# Patient Record
Sex: Female | Born: 1960 | Race: White | Hispanic: No | Marital: Married
Health system: Southern US, Community
[De-identification: ages and names within clinical notes are randomized; demographics above are authoritative.]

---

## 1997-12-05 ENCOUNTER — Ambulatory Visit (HOSPITAL_BASED_OUTPATIENT_CLINIC_OR_DEPARTMENT_OTHER): Admission: RE | Admit: 1997-12-05 | Discharge: 1997-12-05 | Payer: Self-pay | Admitting: General Surgery

## 1999-12-09 ENCOUNTER — Other Ambulatory Visit: Admission: RE | Admit: 1999-12-09 | Discharge: 1999-12-09 | Payer: Self-pay | Admitting: *Deleted

## 2000-11-16 ENCOUNTER — Inpatient Hospital Stay (HOSPITAL_COMMUNITY): Admission: AD | Admit: 2000-11-16 | Discharge: 2000-11-17 | Payer: Self-pay | Admitting: Obstetrics and Gynecology

## 2000-12-23 ENCOUNTER — Other Ambulatory Visit: Admission: RE | Admit: 2000-12-23 | Discharge: 2000-12-23 | Payer: Self-pay | Admitting: Obstetrics and Gynecology

## 2002-03-16 ENCOUNTER — Other Ambulatory Visit: Admission: RE | Admit: 2002-03-16 | Discharge: 2002-03-16 | Payer: Self-pay | Admitting: Obstetrics and Gynecology

## 2003-09-01 ENCOUNTER — Other Ambulatory Visit: Admission: RE | Admit: 2003-09-01 | Discharge: 2003-09-01 | Payer: Self-pay | Admitting: Obstetrics and Gynecology

## 2003-09-19 ENCOUNTER — Other Ambulatory Visit: Admission: RE | Admit: 2003-09-19 | Discharge: 2003-09-19 | Payer: Self-pay | Admitting: Radiology

## 2012-11-08 ENCOUNTER — Other Ambulatory Visit: Payer: Self-pay | Admitting: Otolaryngology

## 2012-11-08 DIAGNOSIS — D333 Benign neoplasm of cranial nerves: Secondary | ICD-10-CM

## 2012-12-06 ENCOUNTER — Ambulatory Visit
Admission: RE | Admit: 2012-12-06 | Discharge: 2012-12-06 | Disposition: A | Discharge status: Home / Self Care | Payer: BC Managed Care – PPO | Source: Ambulatory Visit | Attending: Otolaryngology | Admitting: Otolaryngology

## 2012-12-06 DIAGNOSIS — D333 Benign neoplasm of cranial nerves: Secondary | ICD-10-CM

## 2012-12-06 MED ORDER — GADOBENATE DIMEGLUMINE 529 MG/ML IV SOLN
10.0000 mL | Freq: Once | INTRAVENOUS | Status: AC | PRN
  Administered 2012-12-06: 10 mL via INTRAVENOUS

## 2013-01-13 ENCOUNTER — Ambulatory Visit: Payer: BC Managed Care – PPO | Attending: Otolaryngology | Admitting: Rehabilitative and Restorative Service Providers"

## 2013-01-13 DIAGNOSIS — R269 Unspecified abnormalities of gait and mobility: Secondary | ICD-10-CM | POA: Insufficient documentation

## 2013-01-13 DIAGNOSIS — IMO0001 Reserved for inherently not codable concepts without codable children: Secondary | ICD-10-CM | POA: Insufficient documentation

## 2013-01-19 ENCOUNTER — Ambulatory Visit: Payer: BC Managed Care – PPO | Admitting: Rehabilitative and Restorative Service Providers"

## 2013-02-01 ENCOUNTER — Ambulatory Visit: Payer: BC Managed Care – PPO | Attending: Otolaryngology | Admitting: Rehabilitative and Restorative Service Providers"

## 2013-02-01 DIAGNOSIS — R269 Unspecified abnormalities of gait and mobility: Secondary | ICD-10-CM | POA: Insufficient documentation

## 2013-02-01 DIAGNOSIS — IMO0001 Reserved for inherently not codable concepts without codable children: Secondary | ICD-10-CM | POA: Insufficient documentation

## 2013-05-30 ENCOUNTER — Other Ambulatory Visit: Payer: Self-pay | Admitting: Otolaryngology

## 2013-05-30 DIAGNOSIS — D333 Benign neoplasm of cranial nerves: Secondary | ICD-10-CM

## 2013-05-30 DIAGNOSIS — H905 Unspecified sensorineural hearing loss: Secondary | ICD-10-CM

## 2013-06-09 ENCOUNTER — Ambulatory Visit
Admission: RE | Admit: 2013-06-09 | Discharge: 2013-06-09 | Disposition: A | Discharge status: Home / Self Care | Payer: BC Managed Care – PPO | Source: Ambulatory Visit | Attending: Otolaryngology | Admitting: Otolaryngology

## 2013-06-09 DIAGNOSIS — D333 Benign neoplasm of cranial nerves: Secondary | ICD-10-CM

## 2013-06-09 DIAGNOSIS — H905 Unspecified sensorineural hearing loss: Secondary | ICD-10-CM

## 2013-06-09 MED ORDER — GADOBENATE DIMEGLUMINE 529 MG/ML IV SOLN
10.0000 mL | Freq: Once | INTRAVENOUS | Status: AC | PRN
  Administered 2013-06-09: 10 mL via INTRAVENOUS

## 2013-11-29 ENCOUNTER — Other Ambulatory Visit: Payer: Self-pay | Admitting: Otolaryngology

## 2013-11-29 DIAGNOSIS — D333 Benign neoplasm of cranial nerves: Secondary | ICD-10-CM

## 2013-12-12 ENCOUNTER — Ambulatory Visit
Admission: RE | Admit: 2013-12-12 | Discharge: 2013-12-12 | Disposition: A | Discharge status: Home / Self Care | Payer: BC Managed Care – PPO | Source: Ambulatory Visit | Attending: Otolaryngology | Admitting: Otolaryngology

## 2013-12-12 DIAGNOSIS — D333 Benign neoplasm of cranial nerves: Secondary | ICD-10-CM

## 2013-12-12 MED ORDER — GADOBENATE DIMEGLUMINE 529 MG/ML IV SOLN
11.0000 mL | Freq: Once | INTRAVENOUS | Status: AC | PRN
  Administered 2013-12-12: 11 mL via INTRAVENOUS

## 2014-05-29 ENCOUNTER — Other Ambulatory Visit: Payer: Self-pay | Admitting: Otolaryngology

## 2014-05-29 DIAGNOSIS — D333 Benign neoplasm of cranial nerves: Secondary | ICD-10-CM

## 2014-06-14 ENCOUNTER — Ambulatory Visit
Admission: RE | Admit: 2014-06-14 | Discharge: 2014-06-14 | Disposition: A | Discharge status: Home / Self Care | Payer: BC Managed Care – PPO | Source: Ambulatory Visit | Attending: Otolaryngology | Admitting: Otolaryngology

## 2014-06-14 DIAGNOSIS — D333 Benign neoplasm of cranial nerves: Secondary | ICD-10-CM

## 2014-06-14 MED ORDER — GADOBENATE DIMEGLUMINE 529 MG/ML IV SOLN
12.0000 mL | Freq: Once | INTRAVENOUS | Status: AC | PRN
  Administered 2014-06-14: 12 mL via INTRAVENOUS

## 2014-11-30 ENCOUNTER — Other Ambulatory Visit: Payer: Self-pay | Admitting: Otolaryngology

## 2014-11-30 DIAGNOSIS — D333 Benign neoplasm of cranial nerves: Secondary | ICD-10-CM

## 2014-11-30 DIAGNOSIS — H9041 Sensorineural hearing loss, unilateral, right ear, with unrestricted hearing on the contralateral side: Secondary | ICD-10-CM

## 2014-12-11 ENCOUNTER — Other Ambulatory Visit: Payer: Self-pay

## 2014-12-14 ENCOUNTER — Other Ambulatory Visit: Payer: Self-pay

## 2014-12-14 ENCOUNTER — Ambulatory Visit
Admission: RE | Admit: 2014-12-14 | Discharge: 2014-12-14 | Disposition: A | Discharge status: Home / Self Care | Payer: BLUE CROSS/BLUE SHIELD | Source: Ambulatory Visit | Attending: Otolaryngology | Admitting: Otolaryngology

## 2014-12-14 DIAGNOSIS — D333 Benign neoplasm of cranial nerves: Secondary | ICD-10-CM

## 2014-12-14 DIAGNOSIS — H9041 Sensorineural hearing loss, unilateral, right ear, with unrestricted hearing on the contralateral side: Secondary | ICD-10-CM

## 2014-12-14 MED ORDER — GADOBENATE DIMEGLUMINE 529 MG/ML IV SOLN: 10.0000 mL | Freq: Once | INTRAVENOUS | Status: AC | PRN

## 2015-05-31 ENCOUNTER — Other Ambulatory Visit: Payer: Self-pay | Admitting: Otolaryngology

## 2015-05-31 DIAGNOSIS — D333 Benign neoplasm of cranial nerves: Secondary | ICD-10-CM

## 2015-05-31 DIAGNOSIS — H933X1 Disorders of right acoustic nerve: Secondary | ICD-10-CM

## 2015-06-15 ENCOUNTER — Ambulatory Visit
Admission: RE | Admit: 2015-06-15 | Discharge: 2015-06-15 | Disposition: A | Discharge status: Home / Self Care | Payer: BLUE CROSS/BLUE SHIELD | Source: Ambulatory Visit | Attending: Otolaryngology | Admitting: Otolaryngology

## 2015-06-15 DIAGNOSIS — D333 Benign neoplasm of cranial nerves: Secondary | ICD-10-CM

## 2015-06-15 DIAGNOSIS — H933X1 Disorders of right acoustic nerve: Secondary | ICD-10-CM

## 2015-06-15 MED ORDER — GADOBENATE DIMEGLUMINE 529 MG/ML IV SOLN
11.0000 mL | Freq: Once | INTRAVENOUS | Status: AC | PRN
  Administered 2015-06-15: 11 mL via INTRAVENOUS

## 2015-11-19 ENCOUNTER — Other Ambulatory Visit: Payer: Self-pay | Admitting: Otolaryngology

## 2015-11-19 DIAGNOSIS — D333 Benign neoplasm of cranial nerves: Secondary | ICD-10-CM

## 2015-11-29 ENCOUNTER — Ambulatory Visit
Admission: RE | Admit: 2015-11-29 | Discharge: 2015-11-29 | Disposition: A | Discharge status: Home / Self Care | Payer: BLUE CROSS/BLUE SHIELD | Source: Ambulatory Visit | Attending: Otolaryngology | Admitting: Otolaryngology

## 2015-11-29 DIAGNOSIS — D333 Benign neoplasm of cranial nerves: Secondary | ICD-10-CM

## 2015-11-29 MED ORDER — GADOBENATE DIMEGLUMINE 529 MG/ML IV SOLN
10.0000 mL | Freq: Once | INTRAVENOUS | Status: AC | PRN
  Administered 2015-11-29: 10 mL via INTRAVENOUS

## 2016-05-30 ENCOUNTER — Other Ambulatory Visit: Payer: Self-pay | Admitting: Otolaryngology

## 2016-05-30 DIAGNOSIS — H933X1 Disorders of right acoustic nerve: Secondary | ICD-10-CM

## 2016-06-11 ENCOUNTER — Ambulatory Visit
Admission: RE | Admit: 2016-06-11 | Discharge: 2016-06-11 | Disposition: A | Discharge status: Home / Self Care | Payer: BLUE CROSS/BLUE SHIELD | Source: Ambulatory Visit | Attending: Otolaryngology | Admitting: Otolaryngology

## 2016-06-11 DIAGNOSIS — H933X1 Disorders of right acoustic nerve: Secondary | ICD-10-CM

## 2016-06-11 MED ORDER — GADOBENATE DIMEGLUMINE 529 MG/ML IV SOLN
10.0000 mL | Freq: Once | INTRAVENOUS | Status: AC | PRN
  Administered 2016-06-11: 10 mL via INTRAVENOUS

## 2016-11-14 ENCOUNTER — Other Ambulatory Visit: Payer: Self-pay | Admitting: Otolaryngology

## 2016-11-14 DIAGNOSIS — H933X1 Disorders of right acoustic nerve: Secondary | ICD-10-CM

## 2016-11-27 ENCOUNTER — Ambulatory Visit
Admission: RE | Admit: 2016-11-27 | Discharge: 2016-11-27 | Disposition: A | Discharge status: Home / Self Care | Payer: BLUE CROSS/BLUE SHIELD | Source: Ambulatory Visit | Attending: Otolaryngology | Admitting: Otolaryngology

## 2016-11-27 DIAGNOSIS — H933X1 Disorders of right acoustic nerve: Secondary | ICD-10-CM

## 2016-11-27 MED ORDER — GADOBENATE DIMEGLUMINE 529 MG/ML IV SOLN
10.0000 mL | Freq: Once | INTRAVENOUS | Status: AC | PRN
  Administered 2016-11-27: 10 mL via INTRAVENOUS

## 2017-06-03 ENCOUNTER — Other Ambulatory Visit: Payer: Self-pay | Admitting: Otolaryngology

## 2017-06-03 DIAGNOSIS — H933X1 Disorders of right acoustic nerve: Secondary | ICD-10-CM

## 2017-06-15 ENCOUNTER — Ambulatory Visit
Admission: RE | Admit: 2017-06-15 | Discharge: 2017-06-15 | Disposition: A | Discharge status: Home / Self Care | Payer: BLUE CROSS/BLUE SHIELD | Source: Ambulatory Visit | Attending: Otolaryngology | Admitting: Otolaryngology

## 2017-06-15 DIAGNOSIS — H933X1 Disorders of right acoustic nerve: Secondary | ICD-10-CM

## 2017-06-15 MED ORDER — GADOBENATE DIMEGLUMINE 529 MG/ML IV SOLN
10.0000 mL | Freq: Once | INTRAVENOUS | Status: AC | PRN
  Administered 2017-06-15: 10 mL via INTRAVENOUS

## 2017-12-03 ENCOUNTER — Other Ambulatory Visit: Payer: Self-pay | Admitting: Otolaryngology

## 2017-12-03 DIAGNOSIS — D333 Benign neoplasm of cranial nerves: Secondary | ICD-10-CM

## 2017-12-03 DIAGNOSIS — H933X1 Disorders of right acoustic nerve: Secondary | ICD-10-CM

## 2017-12-14 ENCOUNTER — Ambulatory Visit
Admission: RE | Admit: 2017-12-14 | Discharge: 2017-12-14 | Disposition: A | Discharge status: Home / Self Care | Payer: BLUE CROSS/BLUE SHIELD | Source: Ambulatory Visit | Attending: Otolaryngology | Admitting: Otolaryngology

## 2017-12-14 DIAGNOSIS — D333 Benign neoplasm of cranial nerves: Secondary | ICD-10-CM

## 2017-12-14 DIAGNOSIS — H933X1 Disorders of right acoustic nerve: Secondary | ICD-10-CM

## 2017-12-14 MED ORDER — GADOBENATE DIMEGLUMINE 529 MG/ML IV SOLN
10.0000 mL | Freq: Once | INTRAVENOUS | Status: AC | PRN
  Administered 2017-12-14: 10 mL via INTRAVENOUS

## 2018-05-02 IMAGING — MR MR HEAD WO/W CM
7 of 11 series · 28 of 48 positions shown · IV contrast (multihance)
Comparison: MRI of the brain and IAC 06/15/2015 and 12/06/2012.

CLINICAL DATA: Known right-sided vestibular schwannoma. Right-sided
hearing loss and tinnitus for 4 years.

EXAM:
MRI HEAD WITHOUT AND WITH CONTRAST
TECHNIQUE: Multiplanar, multiecho pulse sequences of the brain and surrounding
structures were obtained without and with intravenous contrast.
CONTRAST:  10mL MULTIHANCE GADOBENATE DIMEGLUMINE 529 MG/ML IV SOLN

[Series 2: T1 · sagittal · 5.0mm · 0.45mm/px · 4 of 19 slices shown]
[im 1/19]
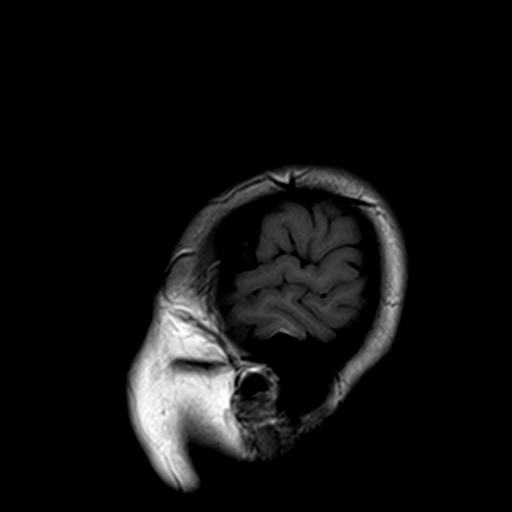
[im 7/19]
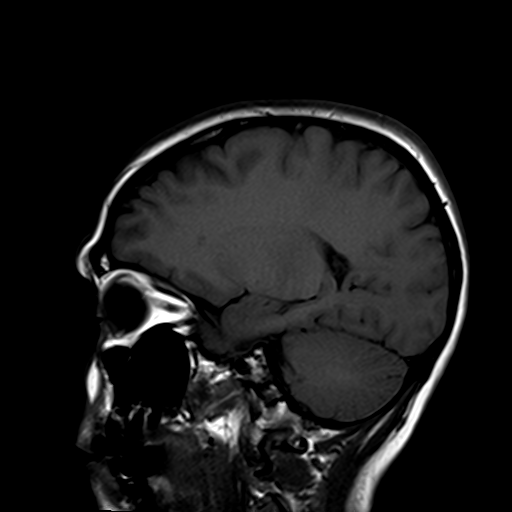
[im 13/19]
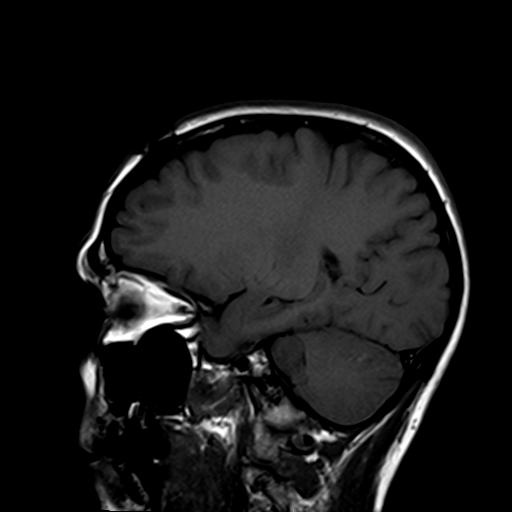
[im 19/19]
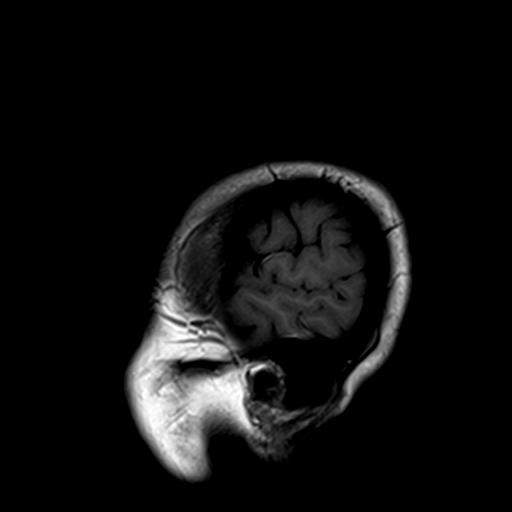

[Series 3: DWI · axial · 3.0mm · 0.94mm/px · z∈[-52,+87]mm · 9 of 96 slices shown]
[im 1/96]
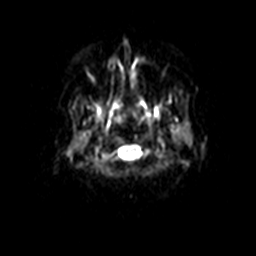
[im 16/96]
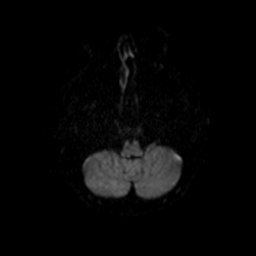
[im 32/96]
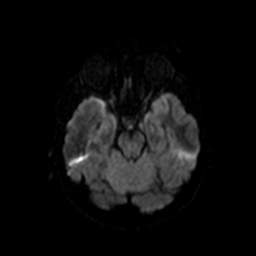
[im 40/96]
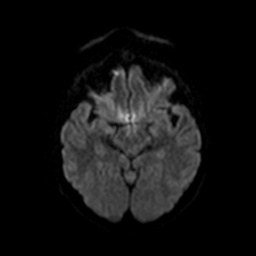
[im 48/96]
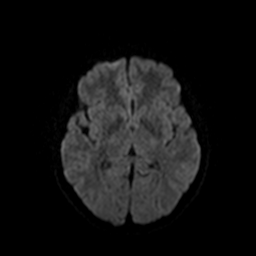
[im 56/96]
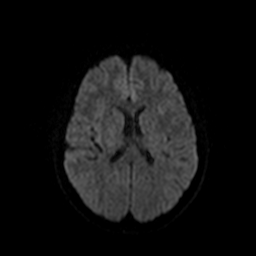
[im 64/96]
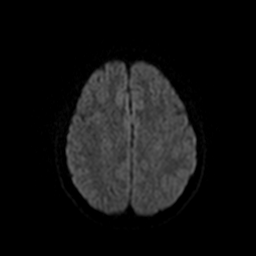
[im 80/96]
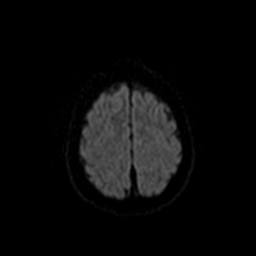
[im 96/96]
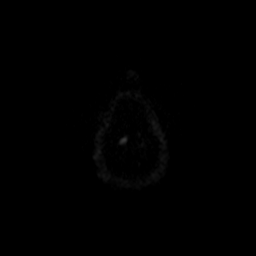

[Series 4: dwi_adc · axial · 3.0mm · 0.94mm/px · z∈[-52,+87]mm · 6 of 48 slices shown]
[im 1/48]
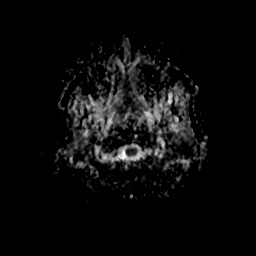
[im 10/48]
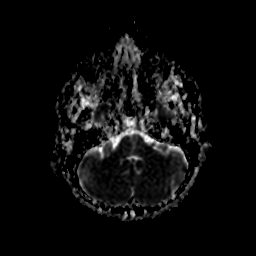
[im 19/48]
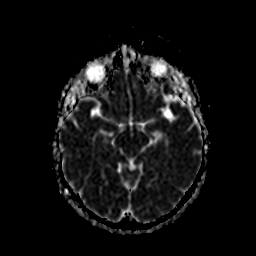
[im 29/48]
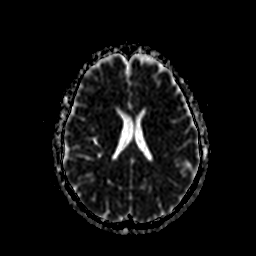
[im 38/48]
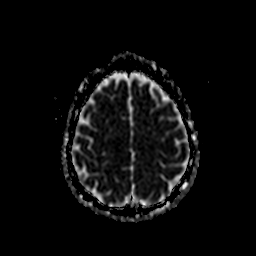
[im 48/48]
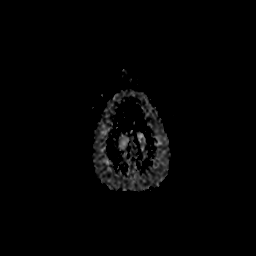

[Series 5: T2 · axial · 5.0mm · 0.45mm/px · z∈[-53,+82]mm · 3 of 22 slices shown]
[im 1/22]
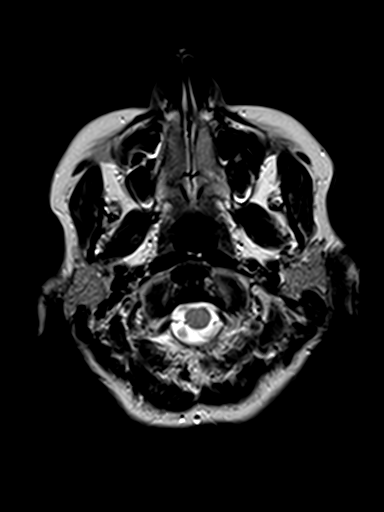
[im 11/22]
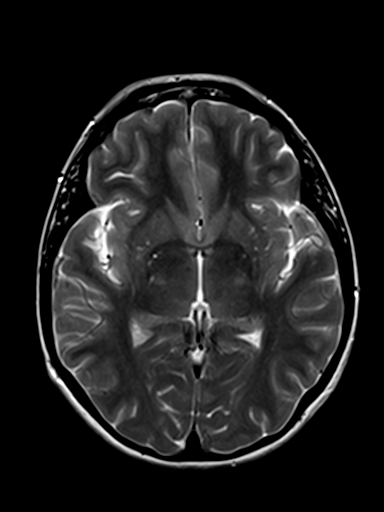
[im 22/22]
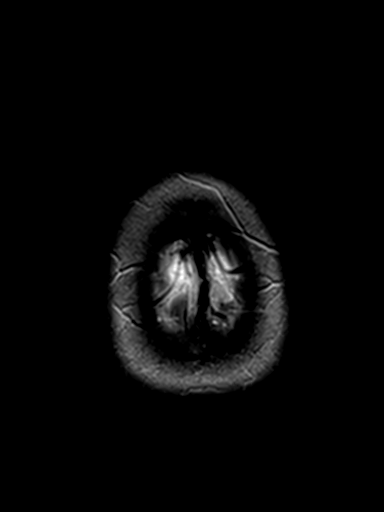

[Series 6: FLAIR · axial · 5.0mm · 0.45mm/px · z∈[-53,+82]mm · 3 of 22 slices shown]
[im 1/22]
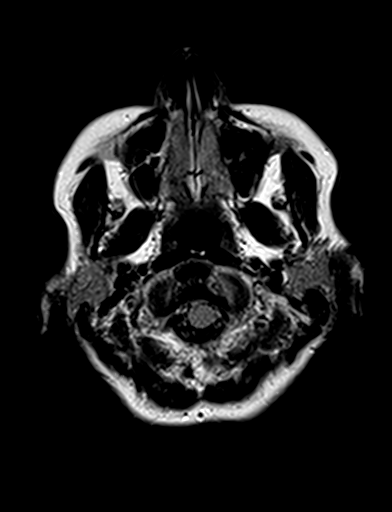
[im 11/22]
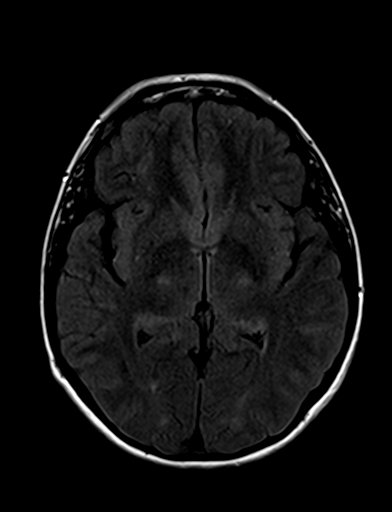
[im 22/22]
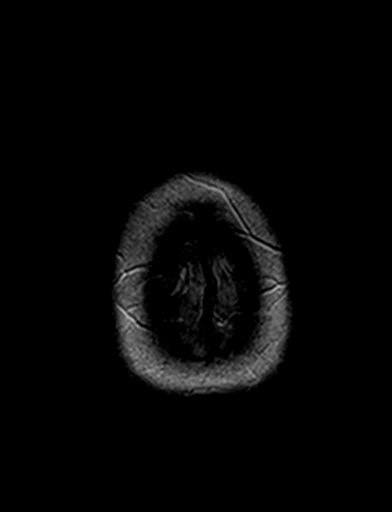

[Series 7: cor 3mm · coronal · 3.0mm · 0.35mm/px · 1 of 11 slices shown]
[im 1/11]
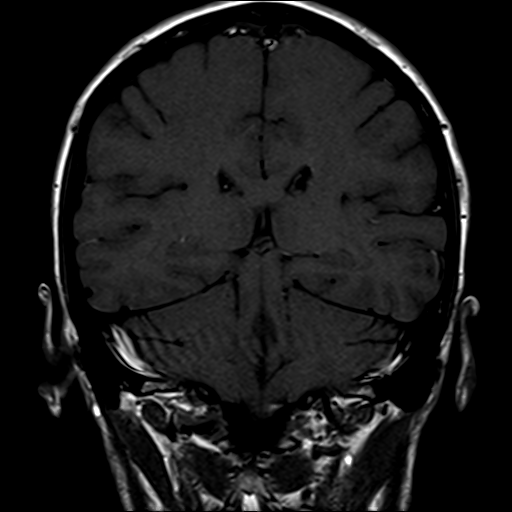

[Series 8: bSSFP · axial · 1.0mm · 0.23mm/px · z∈[-56,-47]mm · 2 of 40 slices shown]
[im 1/40]
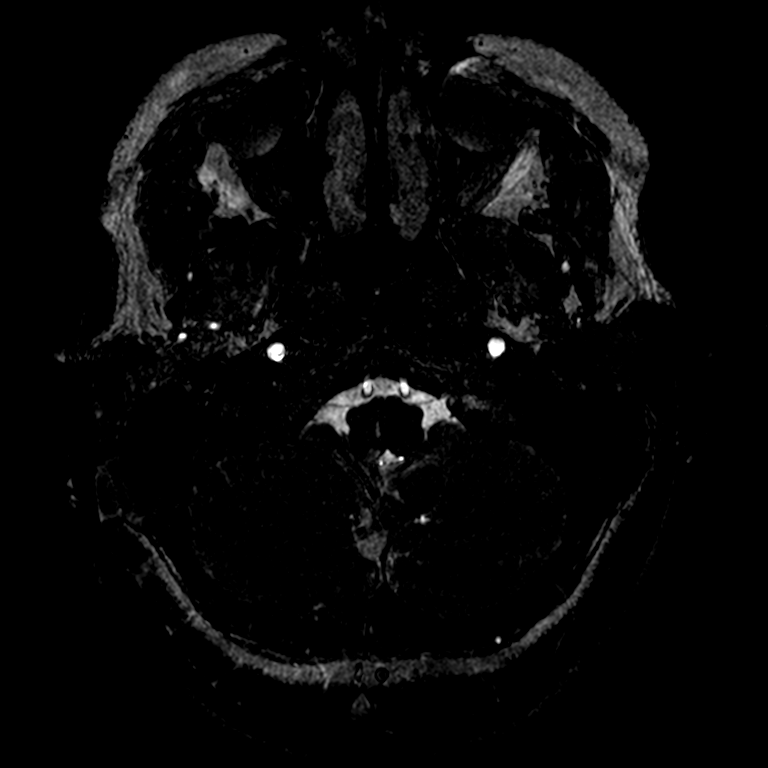
[im 10/40]
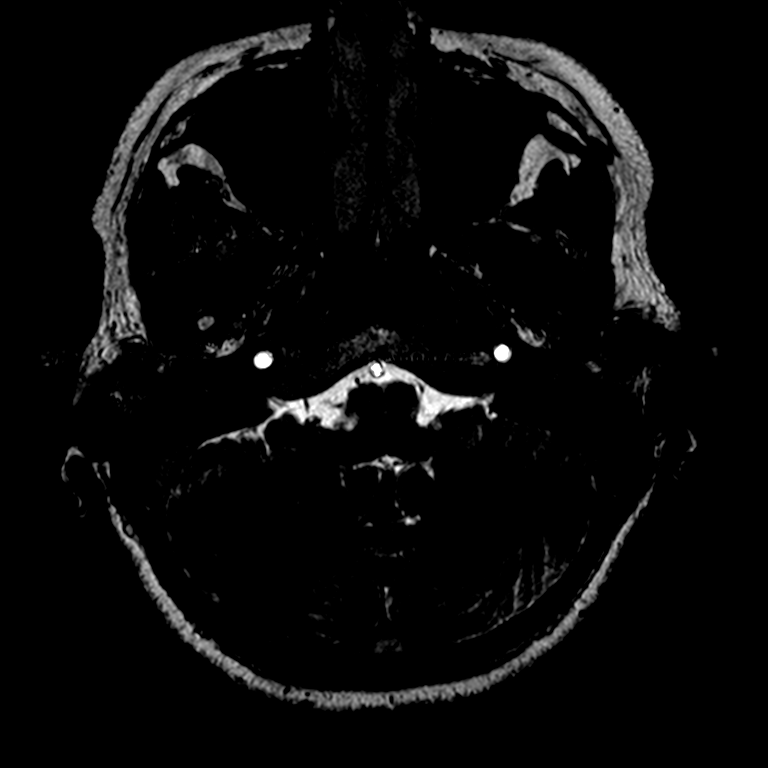

[28 of 48 positions shown; findings below may reference images not displayed]

FINDINGS: A 16 x 14 x 17 mm right CP angle mass lesion is stable. There is
minimal extension beyond the porous acusticus. Mother is little
changed from the May 2015 exam, the lesion has increased in
size since the earliest study of 12/06/2012. Internal auditory
canals are otherwise normal bilaterally. The inner ear structures
are normally formed. The fluid containing spaces are unremarkable.

No acute infarct, hemorrhage, or mass lesion is present. The
ventricles are of normal size. No significant extraaxial fluid
collection is present.

Mild mucosal thickening is present in the maxillary sinuses
bilaterally. The left frontal sinus is opacified.

The skullbase is within normal limits. Midline sagittal images are
otherwise unremarkable.

Postcontrast imaging through the remainder the brain is
unremarkable.
IMPRESSION: 1. Continued slow growth of right CP angle mass lesion compatible
with a vestibular schwannoma. The lesion does not extend
significantly into the internal auditory canal.
2. Otherwise normal MRI of the brain.

## 2018-05-18 ENCOUNTER — Other Ambulatory Visit: Payer: Self-pay | Admitting: Otolaryngology

## 2018-05-18 DIAGNOSIS — D333 Benign neoplasm of cranial nerves: Secondary | ICD-10-CM

## 2018-05-31 ENCOUNTER — Ambulatory Visit
Admission: RE | Admit: 2018-05-31 | Discharge: 2018-05-31 | Disposition: A | Discharge status: Home / Self Care | Payer: BLUE CROSS/BLUE SHIELD | Source: Ambulatory Visit | Attending: Otolaryngology | Admitting: Otolaryngology

## 2018-05-31 DIAGNOSIS — D333 Benign neoplasm of cranial nerves: Secondary | ICD-10-CM

## 2018-05-31 MED ORDER — GADOBENATE DIMEGLUMINE 529 MG/ML IV SOLN
10.0000 mL | Freq: Once | INTRAVENOUS | Status: AC | PRN
  Administered 2018-05-31: 10 mL via INTRAVENOUS

## 2018-08-04 ENCOUNTER — Ambulatory Visit: Payer: BLUE CROSS/BLUE SHIELD | Attending: Otolaryngology | Admitting: Rehabilitative and Restorative Service Providers"

## 2018-08-04 ENCOUNTER — Encounter: Payer: Self-pay | Admitting: Rehabilitative and Restorative Service Providers"

## 2018-08-04 DIAGNOSIS — R2689 Other abnormalities of gait and mobility: Secondary | ICD-10-CM | POA: Diagnosis not present

## 2018-08-04 NOTE — Patient Instructions (Signed)
GENERAL STRENGTH AND CONDITIONING:  Work up to cardio workout 45 minutes non-stop at 60-70% of max heart rate, 3-4 days per week. 60%=98 bpm ,70%=114 bpm, 80%=131 bpm  Work to stay between 60-80% of MHR.  Set up with a trainer for general strength training x 2 days per week.    Feet Heel-Toe "Tandem", Head Motion - Eyes Open    With eyes open, right foot directly in front of the other, move head slowly: up and down x 5 reps, then side to side x 5 reps. Do __2__ sessions per day.   Copyright  VHI. All rights reserved.   Feet Heel-Toe "Tandem", Varied Arm Positions - Eyes Closed    Stand with right foot directly in front of the other and arms at your side. Close eyes and visualize upright position. Hold__30__ seconds. Repeat _3___ times per session. Do _2___ sessions per day.  Copyright  VHI. All rights reserved.

## 2018-08-05 NOTE — Therapy (Signed)
[facility] [address] [place], [place], [address] Phone: [phone]   Fax:  [phone]  Physical Therapy Evaluation  Patient Details  Name: Laurie Greer MRN: [id number] Date of Birth: June 30, 1961 Referring Provider (PT): Erling Cruz   Encounter Date: 08/04/2018  PT End of Session - 08/04/18 1306    Visit Number  1    Number of Visits  4    Date for PT Re-Evaluation  09/03/18    Authorization Type  [employer]    PT Start Time  1020    PT Stop Time  1100    PT Time Calculation (min)  40 min       History reviewed. No pertinent past medical history.  History reviewed. No pertinent surgical history.  There were no vitals filed for this visit.   Subjective Assessment - 08/04/18 1023    Subjective  The patient is known from a prior evaluatoin >5 years ago when dx with acoustic neuroma.  She reports that she anticipates a need for surgery in the future due to a slow growth of the tumor.  She c/o occasional episodes of dizziness, mostly when dehydrated, and occasional bumping into door frame on the right side.  She does not have any hearing loss per report.    Patient Stated Goals  "What can I be doing at home on a daily basis to keep myself stronger to condition for daily living and for surgery."    Currently in Pain?  No/denies         [facility] PT Assessment - 08/04/18 1048      Assessment   Medical Diagnosis  Right acoustic neuroma    Referring Provider (PT)  Erling Cruz    Onset Date/Surgical Date  --   6 years ago     Precautions   Precautions  None      Restrictions   Weight Bearing Restrictions  No      Balance Screen   Has the patient fallen in the past 6 months  No    Has the patient had a decrease in activity level because of a fear of falling?   No    Is the patient reluctant to leave their home because of a fear of falling?   No      Home Film/video editor residence       Prior Function   Level of Independence  Independent    Leisure  Volunteers in community      Ambulation/Gait   Ambulation/Gait  Yes    Ambulation/Gait Assistance  7: Independent    Assistive device  None    Gait Pattern  Within Functional Limits    Ambulation Surface  Level;Indoor      High Level Balance   High Level Balance Comments  tandem stance 6 sec with eyes closed, 30 seconds with eyes open (had to correct intiially and then able to do 30 seconds); single leg x 15 seconds, foam with eyes closed x 30 seconds with sway noted.            Vestibular Assessment - 08/04/18 1049      Symptom Behavior   Type of Dizziness  Imbalance   whooziness   Frequency of Dizziness  intermittent    Duration of Dizziness  brief episodes    Aggravating Factors  --   when fatigued, when dehydrated, movement at times   Relieving Factors  --   drinking  water     Occulomotor Exam   Occulomotor Alignment  Normal    Spontaneous  Absent    Gaze-induced  Absent    Smooth Pursuits  Intact    Saccades  Intact      Vestibulo-Occular Reflex   VOR 1 Head Only (x 1 viewing)  at slow pace, mild sensation of dizziness    VOR Cancellation  Normal    Comment  head impulse test=mild refixation saccade to right (made during movement to the right)      Visual Acuity   Static  line 6   *had retinal issues earlier this year/ still gets floaters   Dynamic  line 3          Objective measurements completed on examination: See above findings.              PT Education - 08/04/18 1254    Education Details  HEP established for general strength/conditioning and high level balance    Person(s) Educated  Patient    Methods  Explanation;Demonstration;Handout    Comprehension  Verbalized understanding;Returned demonstration          PT Long Term Goals - 08/04/18 1307      PT LONG TERM GOAL #1   Title  The patient will be indep with program for high level balance and VOR adaptation.     Time  4    Period  Weeks    Target Date  09/03/18      PT LONG TERM GOAL #2   Title  The patient will verbalize understanding of community wellness program.    Time  4    Period  Weeks    Target Date  09/03/18             Plan - 08/05/18 8756    Clinical Impression Statement  The patient is a 58 year old female presenting to outpatient physical therapy with diagnosis of right acoustic neuroma.  She reports she is planning for surgery with a general goal date of 07/2019 to allow for time to intervier surgical teams and have her youngest daughter transition to college.   At today's evaluation, the patient has dec'd high level balance (tandem with eyes closed), and impairment noted in VOR.  The patient's main goal for therapy is to establish a program for conditioning to prepare for future surgery.  PT to address her impairments with HEP and complete SOT on balance master to have a baseline measure established prior to surgery.    Clinical Presentation  Stable    Clinical Presentation due to:  patient functionally independent    Clinical Decision Making  Low    Rehab Potential  Good    Clinical Impairments Affecting Rehab Potential  patient is active and motivated to participate    PT Frequency  1x / week    PT Duration  4 weeks    PT Treatment/Interventions  ADLs/Self Care Home Management;Therapeutic activities;Therapeutic exercise;Balance training;Neuromuscular re-education;Gait training;Stair training;Patient/family education;Vestibular    PT Next Visit Plan  check HEP; add VOR x 1 viewing, dynamic gait to HEP; and perform SOT.    Consulted and Agree with Plan of Care  Patient       Patient will benefit from skilled therapeutic intervention in order to improve the following deficits and impairments:  Decreased balance, Dizziness  Visit Diagnosis: Other abnormalities of gait and mobility     Problem List There are no active problems to display for this  patient.  Yasira Engelson, PT 08/05/2018, 8:32 AM  [facility] [address] [place], [place], [address] Phone: [phone]   Fax:  [phone]  Name: Laurie Greer MRN: [id number] Date of Birth: 1961-04-15

## 2018-08-26 ENCOUNTER — Ambulatory Visit: Payer: BLUE CROSS/BLUE SHIELD | Admitting: Rehabilitative and Restorative Service Providers"

## 2018-08-26 ENCOUNTER — Encounter: Payer: Self-pay | Admitting: Rehabilitative and Restorative Service Providers"

## 2018-08-26 DIAGNOSIS — R2689 Other abnormalities of gait and mobility: Secondary | ICD-10-CM

## 2018-08-26 NOTE — Patient Instructions (Signed)
GENERAL STRENGTH AND CONDITIONING:  Work up to cardio workout 45 minutes non-stop at 60-70% of max heart rate, 3-4 days per week. 60%=98 bpm ,70%=114 bpm, 80%=131 bpm  Work to stay between 60-80% of MHR.  Set up with a trainer for general strength training x 2 days per week.    Feet Heel-Toe "Tandem", Head Motion - Eyes Open    With eyes open, right foot directly in front of the other, move head slowly: up and down x 5 reps, then side to side x 5 reps. Do __2__ sessions per day.   Copyright  VHI. All rights reserved.   Feet Heel-Toe "Tandem", Varied Arm Positions - Eyes Closed    Stand with right foot directly in front of the other and arms at your side. Close eyes and visualize upright position. Hold__30__ seconds. Repeat _3___ times per session. Do _2___ sessions per day.  Copyright  VHI. All rights reserved.   Gaze Stabilization - Tip Card  1.Target must remain in focus, not blurry, and appear stationary while head is in motion. 2.Perform exercises with small head movements (45 to either side of midline). 3.Increase speed of head motion so long as target is in focus. 4.If you wear eyeglasses, be sure you can see target through lens (therapist will give specific instructions for bifocal / progressive lenses). 5.These exercises may provoke dizziness or nausea. Work through these symptoms. If too dizzy, slow head movement slightly. Rest between each exercise. 6.Exercises demand concentration; avoid distractions. 7.For safety, perform standing exercises close to a counter, wall, corner, or next to someone.  Copyright  VHI. All rights reserved.   Gaze Stabilization - Standing Feet Apart   Feet shoulder width apart, keeping eyes on target on wall 3 feet away, tilt head down slightly and move head side to side for 30-60 seconds (work to 60 as you can tolerate). Repeat while moving head up and down for 30 seconds.  Do one time.  Rest, repeat for 2 sets each time  of day you perform this exercise. Do 3-4 sessions per day.  Goal:  Work up to tolerating 2 minutes nonstop, 2 sets, 3 times per day = 12 minutes total per day.    Copyright  VHI. All rights reserved.

## 2018-08-26 NOTE — Therapy (Addendum)
[facility] [address] [place], [place], [address] Phone: [phone]   Fax:  [phone]  Physical Therapy Treatment  Patient Details  Name: Laurie Greer MRN: [id number] Date of Birth: 1961-01-05 Referring Provider (PT): Erling Cruz   Encounter Date: 08/26/2018  PT End of Session - 08/26/18 1117    Visit Number  2    Number of Visits  4    Date for PT Re-Evaluation  09/03/18    Authorization Type  BCBS    PT Start Time  [phone]    PT Stop Time  0930    PT Time Calculation (min)  32 min    Activity Tolerance  Patient tolerated treatment well    Behavior During Therapy  [facility] for tasks assessed/performed       History reviewed. No pertinent past medical history.  History reviewed. No pertinent surgical history.  There were no vitals filed for this visit.  Subjective Assessment - 08/26/18 0900    Subjective  The patient reports she joined a gym and has started cardio and gentle strength training.     Patient Stated Goals  "What can I be doing at home on a daily basis to keep myself stronger to condition for daily living and for surgery."    Currently in Pain?  No/denies                       [facility] Adult PT Treatment/Exercise - 08/26/18 1118      Standardized Balance Assessment   Standardized Balance Assessment  Balance Master Testing      High Level Balance   High Level Balance Comments  Scores 82% composite equilibrium score compared to age/height norm of 70%.  Patient's previous score years earlier was 70%.      Self-Care   Self-Care  Other Self-Care Comments    Other Self-Care Comments   discussed and wrote down progression of gaze adaptation exercises working up to 12 minutes/day for unilatral vestibular loss (based on CPGs).  Also discussed progression of gym routine.      Vestibular Treatment/Exercise - 08/26/18 1117      Vestibular Treatment/Exercise   Vestibular Treatment Provided   Gaze    Gaze Exercises  X1 Viewing Horizontal;X1 Viewing Vertical      X1 Viewing Horizontal   Foot Position  standing feet together    Comments  30 seconds with cues on head position, speed, and visual fixation to target      X1 Viewing Vertical   Foot Position  standing feet together    Comments  30 seconds without c/o dizziness at fast pace            PT Education - 08/26/18 1116    Education Details  HEP modified to add gaze x 1 viewing    Person(s) Educated  Patient    Methods  Demonstration;Handout;Explanation    Comprehension  Verbalized understanding;Returned demonstration          PT Long Term Goals - 08/04/18 1307      PT LONG TERM GOAL #1   Title  The patient will be indep with program for high level balance and VOR adaptation.    Time  4    Period  Weeks    Target Date  09/03/18      PT LONG TERM GOAL #2   Title  The patient will verbalize understanding of community wellness program.    Time  4  Period  Weeks    Target Date  09/03/18            Plan - 08/26/18 1120    Clinical Impression Statement  The patient is progressing well with community program and has joined a gym and is participating in strength training and cardio to increase fitness level. She is progressing well with home program. Gaze x 1 in horiozntal plane is challenging today for >30 seconds with some visual blurring.  Added to HEP and instructed on how to progress.  Plan to d/c next visit.     PT Treatment/Interventions  ADLs/Self Care Home Management;Therapeutic activities;Therapeutic exercise;Balance training;Neuromuscular re-education;Gait training;Stair training;Patient/family education;Vestibular    PT Next Visit Plan  check HEP, progress VOR, discharge next visit.    Consulted and Agree with Plan of Care  Patient       Patient will benefit from skilled therapeutic intervention in order to improve the following deficits and impairments:  Decreased balance,  Dizziness  Visit Diagnosis: Other abnormalities of gait and mobility    PHYSICAL THERAPY DISCHARGE SUMMARY  Visits from Start of Care: 2  Current functional level related to goals / functional outcomes: See above for patient status-- did not return for last visit.   Remaining deficits: See above   Education / Equipment: Home program.  Plan: Patient agrees to discharge.  Patient goals were not met. Patient is being discharged due to not returning since the last visit.  ?????       Problem List There are no active problems to display for this patient.   [place], [place] 08/26/2018, 11:21 AM  [facility] [address] [place], [place], [address] Phone: [phone]   Fax:  [phone]  Name: Laurie Greer MRN: [id number] Date of Birth: [birth year]

## 2018-09-16 ENCOUNTER — Ambulatory Visit: Payer: BLUE CROSS/BLUE SHIELD | Admitting: Rehabilitative and Restorative Service Providers"

## 2018-09-30 ENCOUNTER — Ambulatory Visit: Payer: BLUE CROSS/BLUE SHIELD | Admitting: Rehabilitative and Restorative Service Providers"

## 2018-10-29 ENCOUNTER — Other Ambulatory Visit: Payer: Self-pay | Admitting: Otolaryngology

## 2018-10-29 DIAGNOSIS — H933X1 Disorders of right acoustic nerve: Secondary | ICD-10-CM

## 2018-11-03 ENCOUNTER — Other Ambulatory Visit: Payer: Self-pay

## 2018-11-03 ENCOUNTER — Ambulatory Visit
Admission: RE | Admit: 2018-11-03 | Discharge: 2018-11-03 | Disposition: A | Discharge status: Home / Self Care | Payer: BLUE CROSS/BLUE SHIELD | Source: Ambulatory Visit | Attending: Otolaryngology | Admitting: Otolaryngology

## 2018-11-03 DIAGNOSIS — H933X1 Disorders of right acoustic nerve: Secondary | ICD-10-CM

## 2018-11-03 MED ORDER — GADOBENATE DIMEGLUMINE 529 MG/ML IV SOLN
10.0000 mL | Freq: Once | INTRAVENOUS | Status: AC | PRN
  Administered 2018-11-03: 10:00:00 10 mL via INTRAVENOUS

## 2018-11-05 ENCOUNTER — Other Ambulatory Visit: Payer: Self-pay | Admitting: Otolaryngology

## 2018-11-05 DIAGNOSIS — H933X1 Disorders of right acoustic nerve: Secondary | ICD-10-CM

## 2019-04-13 ENCOUNTER — Other Ambulatory Visit: Payer: Self-pay | Admitting: Otolaryngology

## 2019-04-13 DIAGNOSIS — D333 Benign neoplasm of cranial nerves: Secondary | ICD-10-CM

## 2019-05-05 ENCOUNTER — Ambulatory Visit
Admission: RE | Admit: 2019-05-05 | Discharge: 2019-05-05 | Disposition: A | Discharge status: Home / Self Care | Payer: BLUE CROSS/BLUE SHIELD | Source: Ambulatory Visit | Attending: Otolaryngology | Admitting: Otolaryngology

## 2019-05-05 DIAGNOSIS — D333 Benign neoplasm of cranial nerves: Secondary | ICD-10-CM

## 2019-10-26 ENCOUNTER — Other Ambulatory Visit: Payer: Self-pay | Admitting: Otolaryngology

## 2019-10-26 DIAGNOSIS — D333 Benign neoplasm of cranial nerves: Secondary | ICD-10-CM

## 2019-11-23 ENCOUNTER — Ambulatory Visit
Admission: RE | Admit: 2019-11-23 | Discharge: 2019-11-23 | Disposition: A | Discharge status: Home / Self Care | Payer: BLUE CROSS/BLUE SHIELD | Source: Ambulatory Visit | Attending: Otolaryngology | Admitting: Otolaryngology

## 2019-11-23 DIAGNOSIS — D333 Benign neoplasm of cranial nerves: Secondary | ICD-10-CM

## 2020-10-16 ENCOUNTER — Other Ambulatory Visit: Payer: Self-pay | Admitting: Otolaryngology

## 2020-10-16 DIAGNOSIS — D333 Benign neoplasm of cranial nerves: Secondary | ICD-10-CM

## 2020-11-05 ENCOUNTER — Other Ambulatory Visit: Payer: Self-pay

## 2020-11-05 ENCOUNTER — Ambulatory Visit
Admission: RE | Admit: 2020-11-05 | Discharge: 2020-11-05 | Disposition: A | Discharge status: Home / Self Care | Payer: BLUE CROSS/BLUE SHIELD | Source: Ambulatory Visit | Attending: Otolaryngology | Admitting: Otolaryngology

## 2020-11-05 DIAGNOSIS — D333 Benign neoplasm of cranial nerves: Secondary | ICD-10-CM

## 2021-04-17 ENCOUNTER — Other Ambulatory Visit: Payer: Self-pay | Admitting: Otolaryngology

## 2021-04-17 DIAGNOSIS — D333 Benign neoplasm of cranial nerves: Secondary | ICD-10-CM

## 2021-05-08 ENCOUNTER — Ambulatory Visit
Admission: RE | Admit: 2021-05-08 | Discharge: 2021-05-08 | Disposition: A | Discharge status: Home / Self Care | Payer: BLUE CROSS/BLUE SHIELD | Source: Ambulatory Visit | Attending: Otolaryngology | Admitting: Otolaryngology

## 2021-05-08 DIAGNOSIS — D333 Benign neoplasm of cranial nerves: Secondary | ICD-10-CM
